# Patient Record
Sex: Female | Born: 1995 | Race: Asian | Hispanic: No | Marital: Single | Smoking: Never smoker
Health system: Southern US, Community
[De-identification: ages and names within clinical notes are randomized; demographics above are authoritative.]

## PROBLEM LIST (undated history)

## (undated) DIAGNOSIS — D649 Anemia, unspecified: Secondary | ICD-10-CM

## (undated) DIAGNOSIS — D509 Iron deficiency anemia, unspecified: Secondary | ICD-10-CM

## (undated) HISTORY — PX: APPENDECTOMY: SHX54

## (undated) HISTORY — DX: Iron deficiency anemia, unspecified: D50.9

---
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
## 2017-10-04 ENCOUNTER — Emergency Department (HOSPITAL_COMMUNITY)
Admission: EM | Admit: 2017-10-04 | Discharge: 2017-10-05 | Disposition: A | Discharge status: Home / Self Care | Payer: Self-pay | Attending: Emergency Medicine | Admitting: Emergency Medicine

## 2017-10-04 ENCOUNTER — Emergency Department (HOSPITAL_COMMUNITY): Payer: Self-pay

## 2017-10-04 ENCOUNTER — Encounter (HOSPITAL_COMMUNITY): Payer: Self-pay | Admitting: *Deleted

## 2017-10-04 DIAGNOSIS — B349 Viral infection, unspecified: Secondary | ICD-10-CM | POA: Insufficient documentation

## 2017-10-04 LAB — CBC WITH DIFFERENTIAL/PLATELET
Basophils Absolute: 0 10*3/uL (ref 0.0–0.1)
Basophils Relative: 0 %
EOS PCT: 0 %
Eosinophils Absolute: 0 10*3/uL (ref 0.0–0.7)
HEMATOCRIT: 40.3 % (ref 36.0–46.0)
HEMOGLOBIN: 12.7 g/dL (ref 12.0–15.0)
Lymphocytes Relative: 21 %
Lymphs Abs: 1.8 10*3/uL (ref 0.7–4.0)
MCH: 21.6 pg — ABNORMAL LOW (ref 26.0–34.0)
MCHC: 31.5 g/dL (ref 30.0–36.0)
MCV: 68.4 fL — AB (ref 78.0–100.0)
MONOS PCT: 3 %
Monocytes Absolute: 0.3 10*3/uL (ref 0.1–1.0)
NEUTROS ABS: 6.5 10*3/uL (ref 1.7–7.7)
NEUTROS PCT: 76 %
Platelets: 330 10*3/uL (ref 150–400)
RBC: 5.89 MIL/uL — ABNORMAL HIGH (ref 3.87–5.11)
RDW: 13.6 % (ref 11.5–15.5)
WBC: 8.6 10*3/uL (ref 4.0–10.5)

## 2017-10-04 LAB — LIPASE, BLOOD: LIPASE: 24 U/L (ref 11–51)

## 2017-10-04 LAB — COMPREHENSIVE METABOLIC PANEL
ALK PHOS: 61 U/L (ref 38–126)
ALT: 10 U/L — ABNORMAL LOW (ref 14–54)
ANION GAP: 10 (ref 5–15)
AST: 18 U/L (ref 15–41)
Albumin: 4.5 g/dL (ref 3.5–5.0)
BILIRUBIN TOTAL: 0.7 mg/dL (ref 0.3–1.2)
BUN: 7 mg/dL (ref 6–20)
CALCIUM: 9.2 mg/dL (ref 8.9–10.3)
CO2: 23 mmol/L (ref 22–32)
Chloride: 103 mmol/L (ref 101–111)
Creatinine, Ser: 0.75 mg/dL (ref 0.44–1.00)
Glucose, Bld: 108 mg/dL — ABNORMAL HIGH (ref 65–99)
POTASSIUM: 3.8 mmol/L (ref 3.5–5.1)
Sodium: 136 mmol/L (ref 135–145)
TOTAL PROTEIN: 7.8 g/dL (ref 6.5–8.1)

## 2017-10-04 LAB — URINALYSIS, ROUTINE W REFLEX MICROSCOPIC
Bilirubin Urine: NEGATIVE
Glucose, UA: NEGATIVE mg/dL
Hgb urine dipstick: NEGATIVE
Ketones, ur: NEGATIVE mg/dL
Leukocytes, UA: NEGATIVE
NITRITE: NEGATIVE
PH: 8 (ref 5.0–8.0)
Protein, ur: NEGATIVE mg/dL
Specific Gravity, Urine: 1.015 (ref 1.005–1.030)

## 2017-10-04 LAB — I-STAT CG4 LACTIC ACID, ED
LACTIC ACID, VENOUS: 2.15 mmol/L — AB (ref 0.5–1.9)
LACTIC ACID, VENOUS: 1.51 mmol/L (ref 0.5–1.9)

## 2017-10-04 LAB — POC URINE PREG, ED: Preg Test, Ur: NEGATIVE

## 2017-10-04 LAB — SALICYLATE LEVEL

## 2017-10-04 MED ORDER — SODIUM CHLORIDE 0.9 % IV BOLUS (SEPSIS)
1000.0000 mL | Freq: Once | INTRAVENOUS | Status: AC
  Administered 2017-10-04: 1000 mL via INTRAVENOUS

## 2017-10-04 MED ORDER — KETOROLAC TROMETHAMINE 15 MG/ML IJ SOLN
15.0000 mg | Freq: Once | INTRAMUSCULAR | Status: AC
  Administered 2017-10-04: 15 mg via INTRAVENOUS
  Filled 2017-10-04: qty 1

## 2017-10-04 MED ORDER — ONDANSETRON 4 MG PO TBDP: 4.0000 mg | ORAL_TABLET | Freq: Three times a day (TID) | ORAL | 0 refills | Status: DC | PRN

## 2017-10-04 MED ORDER — ACETAMINOPHEN 325 MG PO TABS
ORAL_TABLET | ORAL | Status: AC
  Filled 2017-10-04: qty 2

## 2017-10-04 MED ORDER — ACETAMINOPHEN 325 MG PO TABS
650.0000 mg | ORAL_TABLET | Freq: Once | ORAL | Status: AC | PRN
  Administered 2017-10-04: 650 mg via ORAL

## 2017-10-04 NOTE — ED Triage Notes (Signed)
Pt fell last Thursday after tripping on a bookbag and hit her head on a wall. Pt has been having dizziness, NV, and abdominal cramping since Friday. Pt denies urinary problems or cough; has been having chills with fever and was taking aspirin and ibuprofen for pain

## 2017-10-04 NOTE — ED Notes (Signed)
Dr Adela LankFloyd given a copy of lactic acid results 2.15

## 2017-10-04 NOTE — Discharge Instructions (Signed)
Take tylenol 2 pills 4 times a day and motrin 4 pills 3 times a day.  Drink plenty of fluids.  Return for worsening shortness of breath, headache, confusion. Follow up with your family doctor.   

## 2017-10-04 NOTE — ED Provider Notes (Signed)
[facility] EMERGENCY [facility]PARTMENT Provider Note   CSN: [id number] Arrival date & time: 10/04/17  [id number]     History   Chief Complaint Chief Complaint  Patient presents with  . Fever    HPI Lauren Bonilla is a 21 y.o. female.  21 yo F with a chief complaint of fevers chills myalgias and nausea.  Going on for about 4 days.  No known sick contacts.  Denies cough congestion vomiting or diarrhea.  Has had some very mild abdominal discomfort.  Denies vaginal bleeding or discharge.  Has tried Tylenol and aspirin with transient relief.  Had decreased oral intake.   The history is provided by the patient.  Fever   This is a new problem. The current episode started more than 2 days ago. The problem occurs constantly. The problem has not changed since onset.The maximum temperature noted was 102 to 102.9 F. The temperature was taken using an oral thermometer. Associated symptoms include muscle aches. Pertinent negatives include no chest pain, no vomiting, no congestion and no headaches. She has tried acetaminophen (aspirin) for the symptoms. The treatment provided no relief.    History reviewed. No pertinent past medical history.  Patient Active Problem List   Diagnosis Date Noted  . Acute appendicitis 06/18/2015  . Appendicitis with perforation 06/18/2015    Past Surgical History:  Procedure Laterality Date  . LAPAROSCOPIC APPENDECTOMY N/A 06/18/2015   Procedure: APPENDECTOMY LAPAROSCOPIC;  Surgeon: Leonia CoronaShuaib Farooqui, MD;  Location: [facility] OR;  Service: Pediatrics;  Laterality: N/A;    OB History    Gravida Para Term Preterm AB Living   0 0 0 0 0 0   SAB TAB Ectopic Multiple Live Births   0 0 0 0         Home Medications    Prior to Admission medications   Medication Sig Start Date End Date Taking? Authorizing Provider  bismuth subsalicylate (PEPTO BISMOL) 262 MG/15ML suspension Take 30 mLs by mouth every 6 (six) hours as needed for indigestion or diarrhea or loose  stools.    [provider]  cephALEXin (KEFLEX) 500 MG capsule Take 1 capsule (500 mg total) by mouth 4 (four) times daily. Take all of medicine and drink lots of fluids 06/21/16   Linna HoffKindl, James D, MD  HYDROcodone-acetaminophen (HYCET) 7.5-325 mg/15 ml solution Take 10 mLs by mouth every 6 (six) hours as needed for moderate pain. 06/19/15   Leonia CoronaFarooqui, Shuaib, MD  ondansetron (ZOFRAN ODT) 4 MG disintegrating tablet Take 1 tablet (4 mg total) by mouth every 8 (eight) hours as needed for nausea or vomiting. 10/04/17   Melene PlanFloyd, Chamari Cutbirth, DO    Family History No family history on file.  Social History Social History  Substance Use Topics  . Smoking status: Never Smoker  . Smokeless tobacco: Never Used  . Alcohol use No     Allergies   Patient has no known allergies.   Review of Systems Review of Systems  Constitutional: Positive for chills and fever.  HENT: Negative for congestion and rhinorrhea.   Eyes: Negative for redness and visual disturbance.  Respiratory: Negative for shortness of breath and wheezing.   Cardiovascular: Negative for chest pain and palpitations.  Gastrointestinal: Positive for nausea. Negative for vomiting.  Genitourinary: Negative for dysuria and urgency.  Musculoskeletal: Positive for arthralgias and myalgias.  Skin: Negative for pallor and wound.  Neurological: Negative for dizziness and headaches.     Physical Exam Updated Vital Signs BP 111/79   Pulse (!) 106  Temp (!) 101 F (38.3 C) (Oral)   Resp 12   LMP 09/10/2017   SpO2 98%   Physical Exam  Constitutional: She is oriented to person, place, and time. She appears well-developed and well-nourished. No distress.  HENT:  Head: Normocephalic and atraumatic.  Eyes: Pupils are equal, round, and reactive to light. EOM are normal.  Neck: Normal range of motion. Neck supple.  Cardiovascular: Regular rhythm.  Tachycardia present.  Exam reveals no gallop and no friction rub.   No murmur  heard. Pulmonary/Chest: Effort normal. She has no wheezes. She has no rales.  Abdominal: Soft. She exhibits no distension and no mass. There is no tenderness. There is no guarding.  Musculoskeletal: She exhibits no edema or tenderness.  Neurological: She is alert and oriented to person, place, and time.  Skin: Skin is warm and dry. She is not diaphoretic.  Psychiatric: She has a normal mood and affect. Her behavior is normal.  Nursing note and vitals reviewed.    ED Treatments / Results  Labs (all labs ordered are listed, but only abnormal results are displayed) Labs Reviewed  COMPREHENSIVE METABOLIC PANEL - Abnormal; Notable for the following:       Result Value   Glucose, Bld 108 (*)    ALT 10 (*)    All other components within normal limits  CBC WITH DIFFERENTIAL/PLATELET - Abnormal; Notable for the following:    RBC 5.89 (*)    MCV 68.4 (*)    MCH 21.6 (*)    All other components within normal limits  I-STAT CG4 LACTIC ACID, ED - Abnormal; Notable for the following:    Lactic Acid, Venous 2.15 (*)    All other components within normal limits  URINALYSIS, ROUTINE W REFLEX MICROSCOPIC  LIPASE, BLOOD  SALICYLATE LEVEL  INFLUENZA PANEL BY PCR (TYPE A & B)  POC URINE PREG, ED  I-STAT CG4 LACTIC ACID, ED    EKG  EKG Interpretation  Date/Time:  Wednesday October 04 2017 19:38:26 EDT Ventricular Rate:  162 PR Interval:    QRS Duration: 66 QT Interval:  294 QTC Calculation: 482 R Axis:   95 Text Interpretation:  Supraventricular tachycardia Rightward axis Nonspecific ST and T wave abnormality Abnormal ECG No old tracing to compare Confirmed by Melene Plan [phone]) on 10/04/2017 9:58:45 PM       Radiology Dg Chest 2 View  Result Date: 10/04/2017 CLINICAL DATA:  Fevers EXAM: CHEST  2 VIEW COMPARISON:  05/10/2013 FINDINGS: The heart size and mediastinal contours are within normal limits. Both lungs are clear. The visualized skeletal structures are unremarkable.  IMPRESSION: No active cardiopulmonary disease. Electronically Signed   By: Alcide Clever M.D.   On: 10/04/2017 21:21    Procedures Procedures (including critical care time)  Medications Ordered in ED Medications  acetaminophen (TYLENOL) tablet 650 mg (650 mg Oral Given 10/04/17 1934)  sodium chloride 0.9 % bolus 1,000 mL (1,000 mLs Intravenous New Bag/Given 10/04/17 2236)  ketorolac (TORADOL) 15 MG/ML injection 15 mg (15 mg Intravenous Given 10/04/17 2235)     Initial Impression / Assessment and Plan / ED Course  I have reviewed the triage vital signs and the nursing notes.  Pertinent labs & imaging results that were available during my care of the patient were reviewed by me and considered in my medical decision making (see chart for details).     21 yo F with a chief complaint of fevers chills myalgias and nausea.  Somewhat flulike and its acute onset.  Patient had significant tachycardia on arrival into the 160s.  Improved with IV fluids Tylenol and Toradol.  Patient denies chest pain or shortness of breath.  Feeling much better on reassessment.  Salicylate level negative.  Discharge home.  CRITICAL CARE Performed by: Rae Roam   Total critical care time: 35 minutes  Critical care time was exclusive of separately billable procedures and treating other patients.  Critical care was necessary to treat or prevent imminent or life-threatening deterioration.  Critical care was time spent personally by me on the following activities: development of treatment plan with patient and/or surrogate as well as nursing, discussions with consultants, evaluation of patient's response to treatment, examination of patient, obtaining history from patient or surrogate, ordering and performing treatments and interventions, ordering and review of laboratory studies, ordering and review of radiographic studies, pulse oximetry and re-evaluation of patient's condition.   11:19 PM:  I have  discussed the diagnosis/risks/treatment options with the patient and family and believe the pt to be eligible for discharge home to follow-up with PCP. We also discussed returning to the ED immediately if new or worsening sx occur. We discussed the sx which are most concerning (e.g., sudden worsening pain, fever, inability to tolerate by mouth) that necessitate immediate return. Medications administered to the patient during their visit and any new prescriptions provided to the patient are listed below.  Medications given during this visit Medications  acetaminophen (TYLENOL) tablet 650 mg (650 mg Oral Given 10/04/17 1934)  sodium chloride 0.9 % bolus 1,000 mL (1,000 mLs Intravenous New Bag/Given 10/04/17 2236)  ketorolac (TORADOL) 15 MG/ML injection 15 mg (15 mg Intravenous Given 10/04/17 2235)     The patient appears reasonably screen and/or stabilized for discharge and I doubt any other medical condition or other [facility] requiring further screening, evaluation, or treatment in the ED at this time prior to discharge.    Final Clinical Impressions(s) / ED Diagnoses   Final diagnoses:  Viral illness    New Prescriptions New Prescriptions   ONDANSETRON (ZOFRAN ODT) 4 MG DISINTEGRATING TABLET    Take 1 tablet (4 mg total) by mouth every 8 (eight) hours as needed for nausea or vomiting.     Melene Plan, DO 10/04/17 2319

## 2017-10-05 LAB — INFLUENZA PANEL BY PCR (TYPE A & B)
INFLAPCR: NEGATIVE
INFLBPCR: NEGATIVE

## 2018-12-12 NOTE — L&D Delivery Note (Signed)
Delivery Note Patient pushed for approximately 2 hours after she was noted to be C/C/+2.  At 8:33 PM a viable and healthy female was delivered via Vaginal, Spontaneous (Presentation:ROA).  APGAR: 8/9 weight pending. Shoulders and body easily delivered.  Baby laid on maternal abdomen. Delayed cord clamping done. Cord then double clamped and cut. Cord blood obtained. Placenta spontaneously delivered intact, 3 vessels noted.  Uterine atony alleviated by massage and IV pitocin.  Second degree vaginal and perineal laceration noted with bilateral labial lacerations.  The sphincter was intact but stretched.  The second degree was repaired with 2-0 vicryl in routine fashion. The labial lacerations were repaired with interrupted suture of 3-0 vicryl on SH.  Patient tolerated delivery well. There were no complications.   Anesthesia:  Epidural Episiotomy: None Lacerations: 2nd degree;Perineal bilateral labial Suture Repair: 2.0 vicryl Est. Blood Loss (mL):  336 Mom to postpartum.  Baby to Couplet care / Skin to Skin.  [place], [place] 06/25/2019, 9:07 PM

## 2019-06-25 ENCOUNTER — Encounter (HOSPITAL_COMMUNITY): Payer: Self-pay | Admitting: *Deleted

## 2019-06-25 ENCOUNTER — Inpatient Hospital Stay (HOSPITAL_COMMUNITY): Payer: Medicaid Other | Admitting: Anesthesiology

## 2019-06-25 ENCOUNTER — Other Ambulatory Visit: Payer: Self-pay

## 2019-06-25 ENCOUNTER — Inpatient Hospital Stay (HOSPITAL_COMMUNITY)
Admission: EM | Admit: 2019-06-25 | Discharge: 2019-06-27 | DRG: 807 | Disposition: A | Discharge status: Home / Self Care | Payer: Medicaid Other | Attending: Obstetrics & Gynecology | Admitting: Obstetrics & Gynecology

## 2019-06-25 DIAGNOSIS — Z3A39 39 weeks gestation of pregnancy: Secondary | ICD-10-CM | POA: Diagnosis not present

## 2019-06-25 DIAGNOSIS — O26893 Other specified pregnancy related conditions, third trimester: Secondary | ICD-10-CM | POA: Diagnosis present

## 2019-06-25 DIAGNOSIS — Z1159 Encounter for screening for other viral diseases: Secondary | ICD-10-CM

## 2019-06-25 HISTORY — DX: Anemia, unspecified: D64.9

## 2019-06-25 LAB — OB RESULTS CONSOLE GC/CHLAMYDIA
Chlamydia: NEGATIVE
Gonorrhea: NEGATIVE

## 2019-06-25 LAB — TYPE AND SCREEN
ABO/RH(D): AB POS
Antibody Screen: NEGATIVE

## 2019-06-25 LAB — COMPREHENSIVE METABOLIC PANEL
ALT: 9 U/L (ref 0–44)
AST: 20 U/L (ref 15–41)
Albumin: 3.1 g/dL — ABNORMAL LOW (ref 3.5–5.0)
Alkaline Phosphatase: 187 U/L — ABNORMAL HIGH (ref 38–126)
Anion gap: 12 (ref 5–15)
BUN: 8 mg/dL (ref 6–20)
CO2: 23 mmol/L (ref 22–32)
Calcium: 9.8 mg/dL (ref 8.9–10.3)
Chloride: 105 mmol/L (ref 98–111)
Creatinine, Ser: 0.74 mg/dL (ref 0.44–1.00)
GFR calc Af Amer: >60 mL/min (ref >60)
GFR calc non Af Amer: >60 mL/min (ref >60)
Glucose, Bld: 74 mg/dL (ref 70–99)
Potassium: 4.1 mmol/L (ref 3.5–5.1)
Sodium: 140 mmol/L (ref 135–145)
Total Bilirubin: 0.2 mg/dL — ABNORMAL LOW (ref 0.3–1.2)
Total Protein: 6.7 g/dL (ref 6.5–8.1)

## 2019-06-25 LAB — SARS CORONAVIRUS 2 BY RT PCR (HOSPITAL ORDER, PERFORMED IN ~~LOC~~ HOSPITAL LAB): SARS Coronavirus 2: NEGATIVE

## 2019-06-25 LAB — CBC
HCT: 41.6 % (ref 36.0–46.0)
Hemoglobin: 12.7 g/dL (ref 12.0–15.0)
MCH: 22.6 pg — ABNORMAL LOW (ref 26.0–34.0)
MCHC: 30.5 g/dL (ref 30.0–36.0)
MCV: 74.2 fL — ABNORMAL LOW (ref 80.0–100.0)
Platelets: 178 10*3/uL (ref 150–400)
RBC: 5.61 MIL/uL — ABNORMAL HIGH (ref 3.87–5.11)
RDW: 14.6 % (ref 11.5–15.5)
WBC: 10.4 10*3/uL (ref 4.0–10.5)
nRBC: 0 % (ref 0.0–0.2)

## 2019-06-25 LAB — OB RESULTS CONSOLE ABO/RH: RH Type: POSITIVE

## 2019-06-25 LAB — OB RESULTS CONSOLE GBS: GBS: NEGATIVE

## 2019-06-25 LAB — OB RESULTS CONSOLE HIV ANTIBODY (ROUTINE TESTING): HIV: NONREACTIVE

## 2019-06-25 LAB — OB RESULTS CONSOLE ANTIBODY SCREEN: Antibody Screen: NEGATIVE

## 2019-06-25 LAB — OB RESULTS CONSOLE RUBELLA ANTIBODY, IGM: Rubella: NON-IMMUNE/NOT IMMUNE

## 2019-06-25 LAB — OB RESULTS CONSOLE HEPATITIS B SURFACE ANTIGEN: Hepatitis B Surface Ag: NEGATIVE

## 2019-06-25 LAB — OB RESULTS CONSOLE RPR: RPR: NONREACTIVE

## 2019-06-25 LAB — PROTEIN / CREATININE RATIO, URINE
Creatinine, Urine: 149.5 mg/dL
Protein Creatinine Ratio: 0.1 mg/mg{Cre} (ref 0.00–0.15)
Total Protein, Urine: 15 mg/dL

## 2019-06-25 MED ORDER — PRENATAL MULTIVITAMIN CH
1.0000 | ORAL_TABLET | Freq: Every day | ORAL | Status: DC
  Administered 2019-06-26: 1 via ORAL
  Filled 2019-06-25: qty 1

## 2019-06-25 MED ORDER — EPHEDRINE 5 MG/ML INJ: 10.0000 mg | INTRAVENOUS | Status: DC | PRN

## 2019-06-25 MED ORDER — ONDANSETRON HCL 4 MG/2ML IJ SOLN: 4.0000 mg | Freq: Four times a day (QID) | INTRAMUSCULAR | Status: DC | PRN

## 2019-06-25 MED ORDER — LACTATED RINGERS IV SOLN: 500.0000 mL | Freq: Once | INTRAVENOUS | Status: DC

## 2019-06-25 MED ORDER — LACTATED RINGERS IV SOLN: 500.0000 mL | INTRAVENOUS | Status: DC | PRN

## 2019-06-25 MED ORDER — DIBUCAINE (PERIANAL) 1 % EX OINT: 1.0000 "application " | TOPICAL_OINTMENT | CUTANEOUS | Status: DC | PRN

## 2019-06-25 MED ORDER — ONDANSETRON HCL 4 MG PO TABS: 4.0000 mg | ORAL_TABLET | ORAL | Status: DC | PRN

## 2019-06-25 MED ORDER — DIPHENHYDRAMINE HCL 25 MG PO CAPS: 25.0000 mg | ORAL_CAPSULE | Freq: Four times a day (QID) | ORAL | Status: DC | PRN

## 2019-06-25 MED ORDER — SIMETHICONE 80 MG PO CHEW: 80.0000 mg | CHEWABLE_TABLET | ORAL | Status: DC | PRN

## 2019-06-25 MED ORDER — TETANUS-DIPHTH-ACELL PERTUSSIS 5-2.5-18.5 LF-MCG/0.5 IM SUSP: 0.5000 mL | Freq: Once | INTRAMUSCULAR | Status: DC

## 2019-06-25 MED ORDER — LIDOCAINE HCL (PF) 1 % IJ SOLN
INTRAMUSCULAR | Status: DC | PRN
  Administered 2019-06-25 (×2): 5 mL via EPIDURAL

## 2019-06-25 MED ORDER — SODIUM CHLORIDE (PF) 0.9 % IJ SOLN
INTRAMUSCULAR | Status: DC | PRN
  Administered 2019-06-25: 14 mL/h via EPIDURAL

## 2019-06-25 MED ORDER — OXYCODONE HCL 5 MG PO TABS: 5.0000 mg | ORAL_TABLET | ORAL | Status: DC | PRN

## 2019-06-25 MED ORDER — FENTANYL-BUPIVACAINE-NACL 0.5-0.125-0.9 MG/250ML-% EP SOLN
12.0000 mL/h | EPIDURAL | Status: DC | PRN
  Filled 2019-06-25 (×2): qty 250

## 2019-06-25 MED ORDER — COCONUT OIL OIL: 1.0000 "application " | TOPICAL_OIL | Status: DC | PRN

## 2019-06-25 MED ORDER — OXYCODONE-ACETAMINOPHEN 5-325 MG PO TABS: 1.0000 | ORAL_TABLET | ORAL | Status: DC | PRN

## 2019-06-25 MED ORDER — OXYCODONE-ACETAMINOPHEN 5-325 MG PO TABS: 2.0000 | ORAL_TABLET | ORAL | Status: DC | PRN

## 2019-06-25 MED ORDER — SOD CITRATE-CITRIC ACID 500-334 MG/5ML PO SOLN: 30.0000 mL | ORAL | Status: DC | PRN

## 2019-06-25 MED ORDER — ACETAMINOPHEN 325 MG PO TABS: 650.0000 mg | ORAL_TABLET | ORAL | Status: DC | PRN

## 2019-06-25 MED ORDER — OXYCODONE HCL 5 MG PO TABS: 10.0000 mg | ORAL_TABLET | ORAL | Status: DC | PRN

## 2019-06-25 MED ORDER — OXYTOCIN BOLUS FROM INFUSION
500.0000 mL | Freq: Once | INTRAVENOUS | Status: AC
  Administered 2019-06-25: 500 mL via INTRAVENOUS

## 2019-06-25 MED ORDER — PHENYLEPHRINE 40 MCG/ML (10ML) SYRINGE FOR IV PUSH (FOR BLOOD PRESSURE SUPPORT): 80.0000 ug | PREFILLED_SYRINGE | INTRAVENOUS | Status: DC | PRN

## 2019-06-25 MED ORDER — OXYTOCIN 40 UNITS IN NORMAL SALINE INFUSION - SIMPLE MED
2.5000 [IU]/h | INTRAVENOUS | Status: DC
  Filled 2019-06-25: qty 1000

## 2019-06-25 MED ORDER — LACTATED RINGERS IV SOLN
INTRAVENOUS | Status: DC
  Administered 2019-06-25 (×3): via INTRAVENOUS

## 2019-06-25 MED ORDER — OXYTOCIN 40 UNITS IN NORMAL SALINE INFUSION - SIMPLE MED: 2.5000 [IU]/h | INTRAVENOUS | Status: DC | PRN

## 2019-06-25 MED ORDER — FENTANYL-BUPIVACAINE-NACL 0.5-0.125-0.9 MG/250ML-% EP SOLN: 12.0000 mL/h | EPIDURAL | Status: DC | PRN

## 2019-06-25 MED ORDER — IBUPROFEN 600 MG PO TABS
600.0000 mg | ORAL_TABLET | Freq: Four times a day (QID) | ORAL | Status: DC
  Administered 2019-06-26 – 2019-06-27 (×6): 600 mg via ORAL
  Filled 2019-06-25 (×6): qty 1

## 2019-06-25 MED ORDER — BENZOCAINE-MENTHOL 20-0.5 % EX AERO
1.0000 "application " | INHALATION_SPRAY | CUTANEOUS | Status: DC | PRN
  Administered 2019-06-26: 1 via TOPICAL
  Filled 2019-06-25: qty 56

## 2019-06-25 MED ORDER — DIPHENHYDRAMINE HCL 50 MG/ML IJ SOLN: 12.5000 mg | INTRAMUSCULAR | Status: DC | PRN

## 2019-06-25 MED ORDER — LIDOCAINE HCL (PF) 1 % IJ SOLN: 30.0000 mL | INTRAMUSCULAR | Status: DC | PRN

## 2019-06-25 MED ORDER — SENNOSIDES-DOCUSATE SODIUM 8.6-50 MG PO TABS
2.0000 | ORAL_TABLET | ORAL | Status: DC
  Administered 2019-06-26 (×2): 2 via ORAL
  Filled 2019-06-25 (×2): qty 2

## 2019-06-25 MED ORDER — ZOLPIDEM TARTRATE 5 MG PO TABS: 5.0000 mg | ORAL_TABLET | Freq: Every evening | ORAL | Status: DC | PRN

## 2019-06-25 MED ORDER — WITCH HAZEL-GLYCERIN EX PADS: 1.0000 "application " | MEDICATED_PAD | CUTANEOUS | Status: DC | PRN

## 2019-06-25 MED ORDER — ONDANSETRON HCL 4 MG/2ML IJ SOLN: 4.0000 mg | INTRAMUSCULAR | Status: DC | PRN

## 2019-06-25 MED ORDER — LACTATED RINGERS IV SOLN: INTRAVENOUS | Status: DC

## 2019-06-25 NOTE — ED Triage Notes (Signed)
Pt. In labor, having contraction, Called MAU to [place], RN Clifton James, EMT transferred to MAU

## 2019-06-25 NOTE — MAU Note (Signed)
Presents with c/o regular ctxs since 0745.  Denies VB or LOF.  Endorses +FM.

## 2019-06-25 NOTE — Anesthesia Procedure Notes (Signed)
Epidural Patient location during procedure: OB  Staffing Anesthesiologist: Brae Schaafsma, MD Performed: anesthesiologist   Preanesthetic Checklist Completed: patient identified, site marked, surgical consent, pre-op evaluation, timeout performed, IV checked, risks and benefits discussed and monitors and equipment checked  Epidural Patient position: sitting Prep: DuraPrep Patient monitoring: heart rate, continuous pulse ox and blood pressure Approach: right paramedian Location: L3-L4 Injection technique: LOR saline  Needle:  Needle type: Tuohy  Needle gauge: 17 G Needle length: 9 cm and 9 Needle insertion depth: 5 cm Catheter type: closed end flexible Catheter size: 20 Guage Catheter at skin depth: 10 cm Test dose: negative  Assessment Events: blood not aspirated, injection not painful, no injection resistance, negative IV test and no paresthesia  Additional Notes Patient identified. Risks/Benefits/Options discussed with patient including but not limited to bleeding, infection, nerve damage, paralysis, failed block, incomplete pain control, headache, blood pressure changes, nausea, vomiting, reactions to medication both or allergic, itching and postpartum back pain. Confirmed with bedside nurse the patient's most recent platelet count. Confirmed with patient that they are not currently taking any anticoagulation, have any bleeding history or any family history of bleeding disorders. Patient expressed understanding and wished to proceed. All questions were answered. Sterile technique was used throughout the entire procedure. Please see nursing notes for vital signs. Test dose was given through epidural needle and negative prior to continuing to dose epidural or start infusion. Warning signs of high block given to the patient including shortness of breath, tingling/numbness in hands, complete motor block, or any concerning symptoms with instructions to call for help. Patient was given  instructions on fall risk and not to get out of bed. All questions and concerns addressed with instructions to call with any issues.     

## 2019-06-25 NOTE — H&P (Signed)
Lauren Bonilla is a 23 y.o. female presenting for labor. m OB History    Gravida  1   Para  0   Term  0   Preterm  0   AB  0   Living  0     SAB  0   TAB  0   Ectopic  0   Multiple  0   Live Births             Past Medical History:  Diagnosis Date  . Anemia    Past Surgical History:  Procedure Laterality Date  . APPENDECTOMY    . LAPAROSCOPIC APPENDECTOMY N/A 06/18/2015   Procedure: APPENDECTOMY LAPAROSCOPIC;  Surgeon: Gerald Stabs, MD;  Location: [place];  Service: Pediatrics;  Laterality: N/A;   Family History: family history is not on file. Social History:  reports that she has never smoked. She has never used smokeless tobacco. She reports that she does not drink alcohol or use drugs.     Maternal Diabetes: No Genetic Screening: Declined Maternal Ultrasounds/Referrals: Normal Fetal Ultrasounds or other Referrals:  None Maternal Substance Abuse:  No Significant Maternal Medications:  None Significant Maternal Lab Results:  Group B Strep negative Other Comments:  None  ROS History Dilation: Lip/rim Effacement (%): 100 Station: 0 Exam by:: k fields, rn Blood pressure 119/75, pulse 69, temperature 98.3 F (36.8 C), temperature source Oral, resp. rate 16, height 5\' 1"  (1.549 m), weight 60.8 kg, SpO2 99 %. Exam Physical Exam  Physical Examination: General appearance - alert, well appearing, and in no distress Chest - clear to auscultation, no wheezes, rales or rhonchi, symmetric air entry Heart - normal rate and regular rhythm Abdomen - soft, nontender, nondistended, no masses or organomegaly gravid Pelvic - normal external genitalia, vulva, vagina, cervix, uterus and adnexa,  Bloody show 7/100/-1 Extremities - peripheral pulses normal, no pedal edema, no clubbing or cyanosis, Homan's sign negative bilaterally Prenatal labs: ABO, Rh: AB/Positive/-- (07/14 1153) Antibody: Negative (07/14 1152) Rubella: Nonimmune (07/14 1152) RPR: Nonreactive (07/14  1152)  HBsAg: Negative (07/14 1152)  HIV: Non-reactive (07/14 1152)  GBS: Negative (07/14 1152)   Assessment/Plan: Term in labor Desires Epidural for pain AROM clear Anticipate SVD   Lauren Bonilla A Lauren Bonilla 06/25/2019, 4:21 PM

## 2019-06-25 NOTE — Anesthesia Preprocedure Evaluation (Signed)
Anesthesia Evaluation  Patient identified by MRN, date of birth, ID band Patient awake    Reviewed: Allergy & Precautions, H&P , NPO status , Patient's Chart, lab work & pertinent test results  History of Anesthesia Complications Negative for: history of anesthetic complications  Airway Mallampati: II  TM Distance: >3 FB Neck ROM: full    Dental no notable dental hx. (+) Teeth Intact   Pulmonary neg pulmonary ROS,    Pulmonary exam normal breath sounds clear to auscultation       Cardiovascular negative cardio ROS Normal cardiovascular exam Rhythm:regular Rate:Normal     Neuro/Psych negative neurological ROS  negative psych ROS   GI/Hepatic negative GI ROS, Neg liver ROS,   Endo/Other  negative endocrine ROS  Renal/GU negative Renal ROS  negative genitourinary   Musculoskeletal   Abdominal   Peds  Hematology negative hematology ROS (+)   Anesthesia Other Findings Covid test pending. Full PPE necessary  Reproductive/Obstetrics (+) Pregnancy                             Anesthesia Physical Anesthesia Plan  ASA: II  Anesthesia Plan: Epidural   Post-op Pain Management:    Induction:   PONV Risk Score and Plan:   Airway Management Planned:   Additional Equipment:   Intra-op Plan:   Post-operative Plan:   Informed Consent: I have reviewed the patients History and Physical, chart, labs and discussed the procedure including the risks, benefits and alternatives for the proposed anesthesia with the patient or authorized representative who has indicated his/her understanding and acceptance.       Plan Discussed with:   Anesthesia Plan Comments:         Anesthesia Quick Evaluation

## 2019-06-26 LAB — CBC
HCT: 32.7 % — ABNORMAL LOW (ref 36.0–46.0)
Hemoglobin: 10 g/dL — ABNORMAL LOW (ref 12.0–15.0)
MCH: 22.8 pg — ABNORMAL LOW (ref 26.0–34.0)
MCHC: 30.6 g/dL (ref 30.0–36.0)
MCV: 74.7 fL — ABNORMAL LOW (ref 80.0–100.0)
Platelets: 139 10*3/uL — ABNORMAL LOW (ref 150–400)
RBC: 4.38 MIL/uL (ref 3.87–5.11)
RDW: 14.2 % (ref 11.5–15.5)
WBC: 14.1 10*3/uL — ABNORMAL HIGH (ref 4.0–10.5)
nRBC: 0 % (ref 0.0–0.2)

## 2019-06-26 LAB — ABO/RH: ABO/RH(D): AB POS

## 2019-06-26 LAB — RPR: RPR Ser Ql: NONREACTIVE

## 2019-06-26 NOTE — Progress Notes (Signed)
Post Partum Day 1 Subjective:  Well. Lochia are normal. Voiding, ambulating, tolerating normal diet. nursing going well. Reports perineal pain  Objective: Blood pressure 112/78, pulse 65, temperature 98.6 F (37 C), temperature source Oral, resp. rate 18, height 5\' 1"  (1.549 m), weight 60.8 kg, SpO2 99 %, unknown if currently breastfeeding.  Physical Exam:  General: normal Lochia: appropriate Uterine Fundus: 0/2 firm non-tender  Extremities: No evidence of DVT seen on physical exam. Edema minimal    Recent Labs    06/25/19 1107 06/26/19 0637  HGB 12.7 10.0*  HCT 41.6 32.7*    Assessment/Plan: Normal Post-partum. Continue routine post-partum care. Anticipate discharge tomorrow Will add Sitz bath TID    LOS: 1 day   Dede Query Lauren Paganelli MD 06/26/2019, 10:20 AM

## 2019-06-26 NOTE — Lactation Note (Signed)
This note was copied from a baby's chart. Lactation Consultation Note  Patient Name: Lauren Bonilla Today's Date: 06/26/2019 Reason for consult: Initial assessment;Term;Primapara;1st time breastfeeding  P1 mother whose infant is now 14 hours old.  Mother's feeding preference is breast/bottle.  Baby was asleep in father's arms when I arrived.  Mother had no immediate questions/concerns related to breast feeding.  She is familiar with feeding cues and hand expression and has been able to express drops of colostrum.  Container provided and milk storage times reviewed.  Finger feeding demonstrated.  Encouraged to feed 8-12 times/24 hours or sooner if baby shows feeding cues.  Suggested mother call her RN/Lc for latch assistance as needed.  Mother will be returning to work after leave and has a DEBP for home use.     Maternal Data Formula Feeding for Exclusion: Yes Reason for exclusion: Mother's choice to formula and breast feed on admission Has patient been taught Hand Expression?: Yes Does the patient have breastfeeding experience prior to this delivery?: No  Feeding    LATCH Score                   Interventions    Lactation Tools Discussed/Used     Consult Status Consult Status: Follow-up Date: 06/27/19 Follow-up type: In-patient    Little Ishikawa 06/26/2019, 12:57 PM

## 2019-06-26 NOTE — Discharge Summary (Signed)
SVD OB Discharge Summary     Patient Name: Lauren Bonilla DOB: 07/12/1996 MRN: [id number]  Date of admission: 06/25/2019 Delivering MD: Sanjuana Kava  Date of delivery: 06/25/2019 Type of delivery: SVD  Newborn Data: Sex: Baby Female  Live born female  Birth Weight: 7 lb 0.2 oz (3181 g) APGAR: 67, 9  Newborn Delivery   Birth date/time: 06/25/2019 20:33:00 Delivery type: Vaginal, Spontaneous      Feeding: breast and bottle Infant being discharge to home with mother in stable condition.   Admitting diagnosis: Active Labor Intrauterine pregnancy: [redacted]w[redacted]d     Secondary diagnosis:  Active Problems:   Normal labor   Normal postpartum course                                Complications: None                                                              Intrapartum Procedures: spontaneous vaginal delivery Postpartum Procedures: none Complications-Operative and Postpartum: 2nd degree degree perineal laceration Augmentation: AROM   History of Present Illness: Lauren Bonilla is a 23 y.o. female, G1P1001, who presents at [redacted]w[redacted]d weeks gestation. The patient has been followed at  [facility]  Her pregnancy has been complicated by:  Patient Active Problem List   Diagnosis Date Noted  . Normal postpartum course 06/26/2019  . Normal labor 06/25/2019  . Acute appendicitis 06/18/2015  . Appendicitis with perforation 06/18/2015    Hospital course:  Onset of Labor With Vaginal Delivery     23 y.o. yo G1P1001 at [redacted]w[redacted]d was admitted in Active Labor on 06/25/2019. Patient had an uncomplicated labor course as follows:  Membrane Rupture Time/Date: 1:40 PM ,06/25/2019   Intrapartum Procedures: Episiotomy: None [1]                                         Lacerations:  2nd degree [3];Perineal [11]  Patient had a delivery of a Viable infant. 06/25/2019  Information for the patient's newborn:  Hulme, Girl Kamariyah [[id number]]  Delivery Method: Vaginal, Spontaneous(Filed from  Delivery Summary)     Pateint had an uncomplicated postpartum course.  She is ambulating, tolerating a regular diet, passing flatus, and urinating well. Patient is discharged home in stable condition on 06/27/19.  Postpartum Day # 2 : S/P NSVD due to spontaneous labor. Patient up ad lib, denies syncope or dizziness. Reports consuming regular diet without issues and denies N/V. Patient reports 0 bowel movement + passing flatus.  Denies issues with urination and reports bleeding is "lighter."  Patient is breast and bottle feeding and reports going well.  Desires undecided for postpartum contraception.  Pain is being appropriately managed with use of po meds.   Physical exam  Vitals:   06/26/19 0745 06/26/19 1502 06/26/19 2203 06/27/19 0513  BP: 112/78 112/79 (!) 90/54 109/80  Pulse: 65 (!) 59 70 62  Resp: 18 17 16    Temp: 98.6 F (37 C) 98 F (36.7 C) 98.1 F (36.7 C) 97.7 F (36.5 C)  TempSrc: Oral Oral Oral   SpO2: 99% 98% 99%  Weight:      Height:       General: alert, cooperative and no distress Lochia: appropriate Uterine Fundus: firm Perineum: approximate, no hematoma noted DVT Evaluation: No evidence of DVT seen on physical exam. Negative Homan's sign. No cords or calf tenderness. No significant calf/ankle edema.  Labs: Lab Results  Component Value Date   WBC 14.1 (H) 06/26/2019   HGB 10.0 (L) 06/26/2019   HCT 32.7 (L) 06/26/2019   MCV 74.7 (L) 06/26/2019   PLT 139 (L) 06/26/2019   CMP Latest Ref Rng & Units 06/25/2019  Glucose 70 - 99 mg/dL 74  BUN 6 - 20 mg/dL 8  Creatinine 1.610.44 - 0.961.00 mg/dL 0.450.74  Sodium 409135 - 811145 mmol/L 140  Potassium 3.5 - 5.1 mmol/L 4.1  Chloride 98 - 111 mmol/L 105  CO2 22 - 32 mmol/L 23  Calcium 8.9 - 10.3 mg/dL 9.8  Total Protein 6.5 - 8.1 g/dL 6.7  Total Bilirubin 0.3 - 1.2 mg/dL 9.1(Y0.2(L)  Alkaline Phos 38 - 126 U/L 187(H)  AST 15 - 41 U/L 20  ALT 0 - 44 U/L 9    Date of discharge: 06/27/2019 Discharge Diagnoses: Term  Pregnancy-delivered Discharge instruction: per After Visit Summary and "Baby and Me Booklet".  After visit meds:   Activity:           unrestricted and pelvic rest Advance as tolerated. Pelvic rest for 6 weeks.  Diet:                routine Medications: PNV and Ibuprofen Postpartum contraception: Undecided Condition:  Pt discharge to home with baby in stable  Meds: Allergies as of 06/27/2019   No Known Allergies     Medication List    TAKE these medications   ibuprofen 600 MG tablet Commonly known as: ADVIL Take 1 tablet (600 mg total) by mouth every 6 (six) hours.   prenatal vitamin w/FE, FA 27-1 MG Tabs tablet Take 1 tablet by mouth daily at 12 noon.      Discharge Follow Up:  Follow-up Information    [facility] in 6 week(s).   Specialty: Obstetrics and Gynecology Contact information: [address] [place] [phone]           CondonJade Darrian Goodwill, NP-C, CNM 06/27/2019, 6:59 AM  Dale DurhamJade Hiawatha Dressel, FNP

## 2019-06-26 NOTE — Anesthesia Postprocedure Evaluation (Signed)
Anesthesia Post Note  Patient: Lauren Bonilla  Procedure(s) Performed: AN AD HOC LABOR EPIDURAL     Patient location during evaluation: Mother Baby Anesthesia Type: Epidural Level of consciousness: awake and alert and oriented Pain management: satisfactory to patient Vital Signs Assessment: post-procedure vital signs reviewed and stable Respiratory status: spontaneous breathing and nonlabored ventilation Cardiovascular status: stable Postop Assessment: no headache, no backache, no signs of nausea or vomiting, adequate PO intake, patient able to bend at knees, no apparent nausea or vomiting and able to ambulate (patient up walking) Anesthetic complications: no    Last Vitals:  Vitals:   06/26/19 0358 06/26/19 0745  BP: 103/65 112/78  Pulse: 60 65  Resp: 18 18  Temp: 37 C 37 C  SpO2: 97% 99%    Last Pain:  Vitals:   06/26/19 0745  TempSrc: Oral  PainSc: 0-No pain   Pain Goal:                   Lauren Bonilla

## 2019-06-27 ENCOUNTER — Ambulatory Visit: Payer: Self-pay

## 2019-06-27 LAB — BIRTH TISSUE RECOVERY COLLECTION (PLACENTA DONATION)

## 2019-06-27 MED ORDER — IBUPROFEN 600 MG PO TABS: 600.0000 mg | ORAL_TABLET | Freq: Four times a day (QID) | ORAL | 0 refills | Status: DC

## 2019-06-27 NOTE — Progress Notes (Signed)
MMR VIS given to patient. She declines MMR at this time. Timoteo Ace, RN

## 2019-06-27 NOTE — Lactation Note (Signed)
This note was copied from a baby's chart. Lactation Consultation Note: Mother is a P1 and infant is at 8% weight loss.   [name] arrived in mothers room and she was breastfeeding infant in cradle hold. Infant tugging with strong tug. Mother denies having any nipple pain or tenderness. Observed sustained latch with suckles and swallows. Mother taught to do breast compression.   Advised mother to continue to cue base feed and to allow for cluster feeding . Mother to breastfeed infant 8-12 or more times in 24 hours. Reviewed treatment and prevention of engorgement. Mother was given a hand pump with instructions to use as needed. Mother has a electric pump at home. Mother is aware of available [place] services and community support.   Mother receptive to teaching.   Patient Name: Lauren Bonilla Today's Date: 06/27/2019 Reason for consult: Follow-up assessment   Maternal Data    Feeding Feeding Type: Breast Fed  LATCH Score Latch: Grasps breast easily, tongue down, lips flanged, rhythmical sucking.  Audible Swallowing: Spontaneous and intermittent  Type of Nipple: Everted at rest and after stimulation  Comfort (Breast/Nipple): Soft / non-tender  Hold (Positioning): Assistance needed to correctly position infant at breast and maintain latch.  LATCH Score: 9  Interventions Interventions: Assisted with latch;Skin to skin;Hand express;Breast compression;Adjust position;Support pillows;Position options;Expressed milk;Hand pump  Lactation Tools Discussed/Used     Consult Status Consult Status: Complete    Darla Lesches 06/27/2019, 11:03 AM

## 2022-12-12 NOTE — L&D Delivery Note (Signed)
Delivery Note Labor onset:   07/15/23 Labor Onset Time: 0700 Complete dilation at  0915 Onset of pushing at 0925 FHR second stage Cat 2 Analgesia/Anesthesia intrapartum: none  Guided pushing with maternal urge. Delivery of a viable female at [phone]. Fetal head delivered in LOA position.  Nuchal cord: body cord x1.  Infant placed on maternal abd, dried, and tactile stim.  Cord double clamped after 5 min and cut by Father.  RN x3 present for birth.  Cord blood sample collected: Yes Arterial cord blood sample collected: No  Placenta delivered Lauren Bonilla, intact, with 3 VC.  Placenta to L&D. Uterine tone firm, bleeding moderate w/ clots  1st degree laceration identified.  Hemostatic Anesthesia: none Repair: site hemostatic QBL/EBL (mL): 400 Complications: none APGAR: APGAR (1 MIN): 9  APGAR (5 MINS): 9  APGAR (10 MINS):   Mom to postpartum.  Baby to Couplet care / Skin to Skin. Baby boy "Lauren Bonilla" Circumcision yes  Roma Schanz DNP, CNM 07/15/2023, 10:06 AM

## 2023-01-16 LAB — OB RESULTS CONSOLE RUBELLA ANTIBODY, IGM: Rubella: NON-IMMUNE/NOT IMMUNE

## 2023-01-16 LAB — OB RESULTS CONSOLE HEPATITIS B SURFACE ANTIGEN: Hepatitis B Surface Ag: NEGATIVE

## 2023-01-19 LAB — OB RESULTS CONSOLE RPR: RPR: NONREACTIVE

## 2023-01-26 ENCOUNTER — Other Ambulatory Visit: Payer: Self-pay

## 2023-01-26 ENCOUNTER — Other Ambulatory Visit: Payer: Self-pay | Admitting: Obstetrics and Gynecology

## 2023-01-26 DIAGNOSIS — Z363 Encounter for antenatal screening for malformations: Secondary | ICD-10-CM

## 2023-02-15 ENCOUNTER — Encounter: Payer: Self-pay | Admitting: *Deleted

## 2023-02-23 ENCOUNTER — Ambulatory Visit: Payer: Medicaid Other | Attending: Obstetrics and Gynecology

## 2023-02-23 ENCOUNTER — Ambulatory Visit: Payer: Medicaid Other | Admitting: *Deleted

## 2023-02-23 ENCOUNTER — Ambulatory Visit (HOSPITAL_BASED_OUTPATIENT_CLINIC_OR_DEPARTMENT_OTHER): Payer: Medicaid Other

## 2023-02-23 ENCOUNTER — Other Ambulatory Visit: Payer: Self-pay | Admitting: *Deleted

## 2023-02-23 VITALS — BP 102/53 | HR 92

## 2023-02-23 DIAGNOSIS — O0932 Supervision of pregnancy with insufficient antenatal care, second trimester: Secondary | ICD-10-CM | POA: Diagnosis present

## 2023-02-23 DIAGNOSIS — Z363 Encounter for antenatal screening for malformations: Secondary | ICD-10-CM | POA: Diagnosis present

## 2023-02-23 DIAGNOSIS — D563 Thalassemia minor: Secondary | ICD-10-CM | POA: Diagnosis present

## 2023-02-23 DIAGNOSIS — Z362 Encounter for other antenatal screening follow-up: Secondary | ICD-10-CM

## 2023-02-23 NOTE — Progress Notes (Signed)
[facility] at [facility] [address] Phone:  [phone]   Fax:  [phone]    Name: Lauren Bonilla Indication: Maternal Constant Spring Variant  DOB: 18-Sep-1996 Age: 27 y.o.   EDD: 07/28/2023 LMP: 10/21/2022 Referring Provider:  Rex Kras Hands, NP  EGA: [redacted]w[redacted]d Genetic Counselor: Staci Righter, MS, CGC  OB Hx: G2P1001 Date of Appointment: 02/23/2023  Accompanied by: Kristine Linea Face to Face Time: 30 Minutes   Previous Testing Completed:  Hemoglobin fractionation cascade from 01/16/2023 reviewed. 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  Lauren Bonilla previously completed cell-free DNA screening (cfDNA) in this pregnancy. The result is low risk. This screening significantly reduces the risk that the current pregnancy has Down syndrome, Trisomy 28, Trisomy 56, and common sex chromosome conditions, however, the risk is not zero given the limitations of cfDNA. Additionally, there are many genetic conditions that cannot be detected by cfDNA. Lauren Bonilla previously completed carrier screening. She screened to not be a carrier for Cystic Fibrosis (CF), Spinal Muscular Atrophy (SMA), Duchenne/Becker Muscular Dystrophy and Fragile X syndrome. A negative result on carrier screening reduces the likelihood of being a carrier, however, does not entirely rule out the possibility.   Medical & Family History:  Reports she takes prenatal vitamins. Denies personal history of diabetes, high blood pressure, thyroid conditions, and seizures. Denies bleeding, infections, and fevers in this pregnancy. Denies using tobacco, alcohol, or street drugs in this pregnancy.  Maternal ethnicity reported as Asian and paternal ethnicity reported as Caucasian/Hispanic. Denies consanguinity. Lauren Bonilla denied a family history of chromosome conditions, intellectual disability, autism, birth defects, bone/skeletal disorders, blindness, deafness,  blood disorders, neuromuscular disorders, still births, early infant deaths, and 3 or more pregnancy losses for one person on her prenatal intake questionnaire.     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 Alpha Thalassemia refers to a group of autosomal recessive blood disorders that reduce the amount of hemoglobin, the protein in red blood cells that carries oxygen to tissues throughout the body. Hemoglobin is made up of both alpha globin and beta globin proteins. Alpha Thalassemia is different in its inheritance compared to other hemoglobinopathies as there are two alpha-globin genes on each chromosome 16 (??/??). Alpha Thalassemia occurs when three or more of the four alpha globin genes are deleted. Alpha Thalassemia can also occur when a person has specific changes, called 'point mutations', in two of the four genes. 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 The Constant Spring variant is the most common non-deletion variant in the alpha globin genes. It produces an elongated alpha globin chain protein, which has a thalassemia-like effect on red blood cells. The Constant Spring variant is especially common in [place], [place], and [place] populations. With this result, we know that Lauren Bonilla has three functional copies of the alpha-globin genes while her 4th alpha-globin gene produces this abnormal protein. Each of Lauren Bonilla's children will either inherit two functional genes (??) or one functional gene and one gene with the Constant Spring variation (??CS) from her.  Lauren Bonilla is not at an increased risk to have a baby with fetal hydrops due to Hemoglobin Barts disease (four deleted alpha-globin genes: --/--) regardless of her reproductive Bonilla's carrier status. Having four deleted alpha-globin genes results in severe anemia. Affected babies develop symptoms before birth and without treatment typically  do not survive the newborn period. If Lauren Bonilla reproductive Bonilla is  found to be an Alpha Thalassemia carrier in the cis configuration (two deleted alpha-globin genes on the same chromosome: ??/--) there would be a 25% risk for the current pregnancy to be affected with Hemoglobin H-Constant Spring Disease (two deleted alpha-globin genes and one non-functional alpha-globin gene with the point mutation: --/??CS). People with this condition typically have more severe anemia due to less efficient red blood cell production, moderately severe splenomegaly, leg ulcers, gallstones, jaundice, increased risk for infections, and more significant growth delays.  If Lauren Bonilla is found to have one point mutation or the Constant Spring mutation, there would be a 25% risk for the current pregnancy to be affected with Homozygous Point Mutation Disease or Homozygous Constant Spring disease (aacs/aacs). People with this condition have mild to severe anemia and symptoms similar to those seen in Hemoglobin H Disease.   Given Lauren Bonilla carrier screening result, carrier screening for her reproductive Bonilla was offered to determine risk for the current pregnancy. If both members of a couple are known to be carriers, diagnostic testing is available to determine if the pregnancy is affected. This can be performed via amniocentesis after [redacted] weeks gestation. This procedure involves the removal of a small amount of amniotic fluid from the sac surrounding the pregnancy with the use of a thin needle inserted through the maternal abdomen and uterus. Ultrasound guidance is used throughout the procedure. Possible procedural difficulties and complications that can arise include maternal infection, cramping, bleeding, fluid leakage, and/or pregnancy loss. The risk for pregnancy loss with an amniocentesis is 1/500. If diagnostic testing via amniocentesis is not desired, Alpha Thalassemia testing can be completed after birth.      Patient Plan:  Proceed with: We provided Tama a saliva kit to take home so that her reproductive Bonilla can complete carrier screening for Alpha Thalassemia. We discussed that results should be available approximately 3 weeks from when the laboratory receives his sample.  Informed consent was obtained. All questions were answered.    Thank you for sharing in the care of Lauren Bonilla with [place].  Please do not hesitate to contact us if you have any questions.  Staci Righter, MS, [facility]

## 2023-03-23 ENCOUNTER — Telehealth: Payer: Self-pay | Admitting: Genetics

## 2023-03-23 NOTE — Telephone Encounter (Signed)
Telephone call to Gwenna Esteban to discuss her partner's carrier screening saliva kit. Unfortunately, Kristal's partner Child psychotherapist) did not document the date of collection on his saliva sample before sending it to the laboratory. Therefore, his saliva sample cannot be used for testing. Avelina Laine is going to send Cristian another saliva kit so that he can repeat the sample collection process. Genetic counseling left Irianna a voicemail with this information and the [facility] telephone number if she has questions.

## 2023-03-27 ENCOUNTER — Ambulatory Visit: Payer: Medicaid Other | Admitting: *Deleted

## 2023-03-27 ENCOUNTER — Ambulatory Visit: Payer: Medicaid Other | Attending: Obstetrics

## 2023-03-27 ENCOUNTER — Encounter: Payer: Self-pay | Admitting: *Deleted

## 2023-03-27 ENCOUNTER — Other Ambulatory Visit: Payer: Self-pay | Admitting: *Deleted

## 2023-03-27 DIAGNOSIS — Z3A22 22 weeks gestation of pregnancy: Secondary | ICD-10-CM

## 2023-03-27 DIAGNOSIS — O3663X0 Maternal care for excessive fetal growth, third trimester, not applicable or unspecified: Secondary | ICD-10-CM

## 2023-03-27 DIAGNOSIS — O285 Abnormal chromosomal and genetic finding on antenatal screening of mother: Secondary | ICD-10-CM

## 2023-03-27 DIAGNOSIS — O3662X0 Maternal care for excessive fetal growth, second trimester, not applicable or unspecified: Secondary | ICD-10-CM | POA: Diagnosis not present

## 2023-03-27 DIAGNOSIS — D582 Other hemoglobinopathies: Secondary | ICD-10-CM

## 2023-03-27 DIAGNOSIS — Z3689 Encounter for other specified antenatal screening: Secondary | ICD-10-CM

## 2023-03-27 DIAGNOSIS — Z362 Encounter for other antenatal screening follow-up: Secondary | ICD-10-CM

## 2023-04-12 ENCOUNTER — Telehealth: Payer: Self-pay | Admitting: Obstetrics and Gynecology

## 2023-04-12 NOTE — Telephone Encounter (Signed)
Left voicemail for Ms. Dina to call back to review carrier testing results from her partner, Cari Caraway (dob 04/28/2000).  We can be reached at [phone].  Cherly Anderson, MS, CGC

## 2023-04-17 ENCOUNTER — Telehealth: Payer: Self-pay | Admitting: Obstetrics and Gynecology

## 2023-04-17 NOTE — Telephone Encounter (Signed)
PC to Lauren Bonilla with results of carrier screening for alpha thalassemia for her partner, Lauren Bonilla (dob 04/28/2000).  His results are negative, indicating that he is not a carrier for any changes in the gene for alpha hemoglobin.  This baby has a 50% chance to be a carrier like Lauren Bonilla, but is not at risk for health concerns due to alpha thalassemia.  We can be reached at [phone] with any questions.  Cherly Anderson, MS, CGC

## 2023-05-09 LAB — OB RESULTS CONSOLE HIV ANTIBODY (ROUTINE TESTING): HIV: NONREACTIVE

## 2023-05-09 LAB — HEPATITIS C ANTIBODY: HCV Ab: NEGATIVE

## 2023-05-17 ENCOUNTER — Encounter: Payer: Self-pay | Admitting: *Deleted

## 2023-05-22 ENCOUNTER — Ambulatory Visit: Payer: Medicaid Other | Attending: Obstetrics and Gynecology

## 2023-05-22 ENCOUNTER — Encounter: Payer: Self-pay | Admitting: *Deleted

## 2023-05-22 ENCOUNTER — Ambulatory Visit: Payer: Medicaid Other | Admitting: *Deleted

## 2023-05-22 DIAGNOSIS — O285 Abnormal chromosomal and genetic finding on antenatal screening of mother: Secondary | ICD-10-CM

## 2023-05-22 DIAGNOSIS — Z3A30 30 weeks gestation of pregnancy: Secondary | ICD-10-CM

## 2023-05-22 DIAGNOSIS — Z3689 Encounter for other specified antenatal screening: Secondary | ICD-10-CM | POA: Diagnosis present

## 2023-05-22 DIAGNOSIS — O3663X0 Maternal care for excessive fetal growth, third trimester, not applicable or unspecified: Secondary | ICD-10-CM | POA: Insufficient documentation

## 2023-05-22 DIAGNOSIS — Z148 Genetic carrier of other disease: Secondary | ICD-10-CM

## 2023-07-15 ENCOUNTER — Encounter (HOSPITAL_COMMUNITY): Payer: Self-pay | Admitting: Obstetrics and Gynecology

## 2023-07-15 ENCOUNTER — Inpatient Hospital Stay (HOSPITAL_COMMUNITY)
Admission: AD | Admit: 2023-07-15 | Discharge: 2023-07-16 | DRG: 807 | Disposition: A | Discharge status: Home / Self Care | Payer: Medicaid Other | Attending: Obstetrics and Gynecology | Admitting: Obstetrics and Gynecology

## 2023-07-15 DIAGNOSIS — Z2839 Other underimmunization status: Secondary | ICD-10-CM

## 2023-07-15 DIAGNOSIS — D509 Iron deficiency anemia, unspecified: Secondary | ICD-10-CM | POA: Diagnosis present

## 2023-07-15 DIAGNOSIS — Z3A39 39 weeks gestation of pregnancy: Secondary | ICD-10-CM

## 2023-07-15 DIAGNOSIS — O9902 Anemia complicating childbirth: Secondary | ICD-10-CM | POA: Diagnosis present

## 2023-07-15 DIAGNOSIS — O09899 Supervision of other high risk pregnancies, unspecified trimester: Secondary | ICD-10-CM

## 2023-07-15 DIAGNOSIS — O26893 Other specified pregnancy related conditions, third trimester: Secondary | ICD-10-CM | POA: Diagnosis present

## 2023-07-15 LAB — TYPE AND SCREEN
ABO/RH(D): AB POS
Antibody Screen: NEGATIVE

## 2023-07-15 LAB — CBC
HCT: 36.9 % (ref 36.0–46.0)
Hemoglobin: 11.5 g/dL — ABNORMAL LOW (ref 12.0–15.0)
MCH: 23.1 pg — ABNORMAL LOW (ref 26.0–34.0)
MCHC: 31.2 g/dL (ref 30.0–36.0)
MCV: 74.2 fL — ABNORMAL LOW (ref 80.0–100.0)
Platelets: 280 10*3/uL (ref 150–400)
RBC: 4.97 MIL/uL (ref 3.87–5.11)
RDW: 13.9 % (ref 11.5–15.5)
WBC: 22.2 10*3/uL — ABNORMAL HIGH (ref 4.0–10.5)
nRBC: 0 % (ref 0.0–0.2)

## 2023-07-15 LAB — RPR: RPR Ser Ql: NONREACTIVE

## 2023-07-15 MED ORDER — FLEET ENEMA 7-19 GM/118ML RE ENEM: 1.0000 | ENEMA | RECTAL | Status: DC | PRN

## 2023-07-15 MED ORDER — LIDOCAINE HCL (PF) 1 % IJ SOLN
30.0000 mL | INTRAMUSCULAR | Status: DC | PRN
  Filled 2023-07-15: qty 30

## 2023-07-15 MED ORDER — OXYCODONE-ACETAMINOPHEN 5-325 MG PO TABS: 1.0000 | ORAL_TABLET | ORAL | Status: DC | PRN

## 2023-07-15 MED ORDER — DIPHENHYDRAMINE HCL 25 MG PO CAPS: 25.0000 mg | ORAL_CAPSULE | Freq: Four times a day (QID) | ORAL | Status: DC | PRN

## 2023-07-15 MED ORDER — ONDANSETRON HCL 4 MG/2ML IJ SOLN: 4.0000 mg | INTRAMUSCULAR | Status: DC | PRN

## 2023-07-15 MED ORDER — OXYTOCIN-SODIUM CHLORIDE 30-0.9 UT/500ML-% IV SOLN: 2.5000 [IU]/h | INTRAVENOUS | Status: DC

## 2023-07-15 MED ORDER — PRENATAL MULTIVITAMIN CH
1.0000 | ORAL_TABLET | Freq: Every day | ORAL | Status: DC
  Administered 2023-07-15 – 2023-07-16 (×2): 1 via ORAL
  Filled 2023-07-15 (×2): qty 1

## 2023-07-15 MED ORDER — COCONUT OIL OIL: 1.0000 | TOPICAL_OIL | Status: DC | PRN

## 2023-07-15 MED ORDER — OXYTOCIN 10 UNIT/ML IJ SOLN
INTRAMUSCULAR | Status: AC
  Administered 2023-07-15: 10 [IU] via INTRAMUSCULAR
  Filled 2023-07-15: qty 1

## 2023-07-15 MED ORDER — FENTANYL CITRATE (PF) 100 MCG/2ML IJ SOLN: 50.0000 ug | INTRAMUSCULAR | Status: DC | PRN

## 2023-07-15 MED ORDER — ONDANSETRON HCL 4 MG PO TABS: 4.0000 mg | ORAL_TABLET | ORAL | Status: DC | PRN

## 2023-07-15 MED ORDER — LACTATED RINGERS IV SOLN: 500.0000 mL | INTRAVENOUS | Status: DC | PRN

## 2023-07-15 MED ORDER — IBUPROFEN 600 MG PO TABS
600.0000 mg | ORAL_TABLET | Freq: Four times a day (QID) | ORAL | Status: DC
  Administered 2023-07-15 – 2023-07-16 (×4): 600 mg via ORAL
  Filled 2023-07-15 (×5): qty 1

## 2023-07-15 MED ORDER — SIMETHICONE 80 MG PO CHEW: 80.0000 mg | CHEWABLE_TABLET | ORAL | Status: DC | PRN

## 2023-07-15 MED ORDER — ACETAMINOPHEN 325 MG PO TABS: 650.0000 mg | ORAL_TABLET | ORAL | Status: DC | PRN

## 2023-07-15 MED ORDER — SOD CITRATE-CITRIC ACID 500-334 MG/5ML PO SOLN: 30.0000 mL | ORAL | Status: DC | PRN

## 2023-07-15 MED ORDER — OXYTOCIN BOLUS FROM INFUSION: 333.0000 mL | Freq: Once | INTRAVENOUS | Status: DC

## 2023-07-15 MED ORDER — LACTATED RINGERS IV SOLN: INTRAVENOUS | Status: DC

## 2023-07-15 MED ORDER — SENNOSIDES-DOCUSATE SODIUM 8.6-50 MG PO TABS
2.0000 | ORAL_TABLET | ORAL | Status: DC
  Administered 2023-07-16: 2 via ORAL
  Filled 2023-07-15: qty 2

## 2023-07-15 MED ORDER — DIBUCAINE (PERIANAL) 1 % EX OINT: 1.0000 | TOPICAL_OINTMENT | CUTANEOUS | Status: DC | PRN

## 2023-07-15 MED ORDER — TETANUS-DIPHTH-ACELL PERTUSSIS 5-2.5-18.5 LF-MCG/0.5 IM SUSY: 0.5000 mL | PREFILLED_SYRINGE | Freq: Once | INTRAMUSCULAR | Status: DC

## 2023-07-15 MED ORDER — OXYTOCIN 10 UNIT/ML IJ SOLN: 10.0000 [IU] | Freq: Once | INTRAMUSCULAR | Status: AC

## 2023-07-15 MED ORDER — ONDANSETRON HCL 4 MG/2ML IJ SOLN: 4.0000 mg | Freq: Four times a day (QID) | INTRAMUSCULAR | Status: DC | PRN

## 2023-07-15 MED ORDER — WITCH HAZEL-GLYCERIN EX PADS: 1.0000 | MEDICATED_PAD | CUTANEOUS | Status: DC | PRN

## 2023-07-15 MED ORDER — BENZOCAINE-MENTHOL 20-0.5 % EX AERO: 1.0000 | INHALATION_SPRAY | CUTANEOUS | Status: DC | PRN

## 2023-07-15 NOTE — H&P (Signed)
OB ADMISSION/ HISTORY & PHYSICAL:  Admission Date: 07/15/2023  8:57 AM  Admit Diagnosis: Normal  Lauren Bonilla is a 27 y.o. female G2P1001 [redacted]w[redacted]d presenting for labor eval. Endorses active FM, denies LOF and vaginal bleeding. Ctx began @ 0700. Low-risk pregnancy w/ microcytic anemia and rubella immune  History of current pregnancy: G2P1001   Patient entered care with [facility] at 13+4 wks.   EDC 07/20/23 by LMP and congruent w/ 1st trimester wk U/S.   Significant prenatal events:  Patient Active Problem List   Diagnosis Date Noted   Normal labor and delivery 07/15/2023   Rubella non-immune status, antepartum 07/15/2023    Prenatal Labs: ABO, Rh:  AB pos Antibody:  negative Rubella:   non-immune RPR:   NR HBsAg:   NR HIV:   NR GTT: normal 1 hr GBS:   neg GC/CHL: neg/neg Genetics: low-risk, horizon neg   OB History  Gravida Para Term Preterm AB Living  2 1 1  0 0 1  SAB IAB Ectopic Multiple Live Births  0 0 0 0 1    # Outcome Date GA Lbr Len/2nd Weight Sex Type Anes PTL Lv  2 Current           1 Term 06/25/19 [redacted]w[redacted]d 09:51 / 02:57 3181 g F Vag-Spont EPI  LIV    Medical / Surgical History: Past medical history:  Past Medical History:  Diagnosis Date   Acute appendicitis 06/18/2015   Anemia    Appendicitis with perforation 06/18/2015   Microcytic anemia    Normal labor 06/25/2019   Normal postpartum course 06/26/2019    Past surgical history:  Past Surgical History:  Procedure Laterality Date   APPENDECTOMY     LAPAROSCOPIC APPENDECTOMY N/A 06/18/2015   Procedure: APPENDECTOMY LAPAROSCOPIC;  Surgeon: Leonia Corona, MD;  Location: [facility] OR;  Service: Pediatrics;  Laterality: N/A;   Family History:  Family History  Problem Relation Age of Onset   Asthma Neg Hx    Cancer Neg Hx    Diabetes Neg Hx    Heart disease Neg Hx    Hypertension Neg Hx     Social History:  reports that she has never smoked. She has never used smokeless tobacco. She reports that she does not drink  alcohol and does not use drugs.  Allergies: Patient has no known allergies.   Current Medications at time of admission:  Prior to Admission medications   Medication Sig Start Date End Date Taking? Authorizing Provider  Cholecalciferol (VITAMIN D3) 250 MCG (10000 UT) CAPS Take by mouth.    [provider]  ferrous sulfate 325 (65 FE) MG EC tablet Take 325 mg by mouth 3 (three) times daily with meals.    [provider]  ibuprofen (ADVIL) 600 MG tablet Take 1 tablet (600 mg total) by mouth every 6 (six) hours. Patient not taking: Reported on 02/23/2023 06/27/19   Dale , FNP  prenatal vitamin w/FE, FA (PRENATAL 1 + 1) 27-1 MG TABS tablet Take 1 tablet by mouth daily at 12 noon.    [provider]    Review of Systems: Constitutional: Negative   HENT: Negative   Eyes: Negative   Respiratory: Negative   Cardiovascular: Negative   Gastrointestinal: Negative  Genitourinary: pos for bloody show, neg for LOF   Musculoskeletal: Negative   Skin: Negative   Neurological: Negative   Endo/Heme/Allergies: Negative   Psychiatric/Behavioral: Negative    Physical Exam: VS: Blood pressure 125/78, pulse 91, temperature 98.1 F (36.7 C), temperature  source Oral, resp. rate 20, last menstrual period 10/21/2022, SpO2 98%, unknown if currently breastfeeding. AAO x3, no signs of distress Cardiovascular: RRR Respiratory: Unlabored GU/GI: Abdomen gravid, non-tender, non-distended, active FM, vertex Extremities: no edema, negative for pain, tenderness, and cords  Cervical exam:Dilation: Lip/rim Effacement (%): 100 Station: 0 Exam by:: Erle Crocker RN FHR: baseline rate 115 / variability moderate / accelerations present / absent decelerations TOCO: 3   Prenatal Transfer Tool  Maternal Diabetes: No Genetic Screening: Normal Maternal Ultrasounds/Referrals: Normal Fetal Ultrasounds or other Referrals:  None Maternal Substance Abuse:  No Significant Maternal  Medications:  None Significant Maternal Lab Results: Group B Strep negative Number of Prenatal Visits:greater than 3 verified prenatal visits Other Comments:  None    Assessment: 27 y.o. G2P1001 [redacted]w[redacted]d  Active stage of labor Precipitous labor FHR category 1 GBS neg Pain management plan: per pt request   Plan:  Admit to L&D Routine admission orders Epidural PRN Expectant management Dr Connye Burkitt notified of admission and plan of care  Roma Schanz DNP, CNM 07/15/2023 9:15 AM

## 2023-07-15 NOTE — Lactation Note (Signed)
This note was copied from a baby's chart. Lactation Consultation Note  Patient Name: Lauren Bonilla Today's Date: 07/15/2023 Age:25 hours Reason for consult: Initial assessment;Term.  Infant is latching well at the breast, Birth Parent is feeding infant by hunger cues, on demand, 8 to 12+ times within 24 hours,  Birth Parent latched infant on her left breast using the cross cradle hold, infant latched with depth, breastfeed for 7 minutes. Birth Parent was burping infant as LC left the room, and plans to re-latch him again at the breast if he continue to show hunger cues. Birth Parent is experienced with breastfeeding see maternal data below. LC discussed infant's input and output and changed void diaper while in room, infant had one void and one stool diaper since birth. LC discussed the importance of maternal rest, diet and hydration. Birth Parent was  made aware of O/P services, breastfeeding support groups, community resources, and our phone # for post-discharge questions.    Maternal Data Has patient been taught Hand Expression?: Yes Does the patient have breastfeeding experience prior to this delivery?: Yes How long did the patient breastfeed?: Per Birth Parent, she BF her 42 year old daughter for 18 months.  Feeding Mother's Current Feeding Choice: Breast Milk and Formula  LATCH Score Latch: Grasps breast easily, tongue down, lips flanged, rhythmical sucking.  Audible Swallowing: A few with stimulation  Type of Nipple: Everted at rest and after stimulation  Comfort (Breast/Nipple): Soft / non-tender  Hold (Positioning): Assistance needed to correctly position infant at breast and maintain latch.  LATCH Score: 8   Lactation Tools Discussed/Used    Interventions Interventions: Support pillows;Position options;Skin to skin;Assisted with latch;Breast feeding basics reviewed;Adjust position;Education;Breast compression;LC Services brochure  Discharge Pump:  [facility] sent referral for  [employer])  Consult Status Consult Status: Follow-up Date: 07/16/23 Follow-up type: In-patient    Frederico Hamman 07/15/2023, 9:55 PM

## 2023-07-15 NOTE — MAU Note (Signed)
Patient arrived to MAU reporting CTX's with an urge to push. RN assisted patient into stretcher and applied FHR monitors. Initial FHT at 120. CTX's palpating strong.   SVE performed. Patient [phone] with a BBOW.  Steva Ready, DO, notified and report given. DO not in house but reports [place], CNM, may still be. RN to call CNM.  This RN called Rhea Pink, CNM, and report given. CNM in house and to see patient. RN informed CNM that patient is going to 4. CNM stated she would place admit orders.   [employer] civil engineer, called by [employer], RN, and report given. Patient to go to room 218.  Awilda Bill, NP, assisted transport of patient to room 218.

## 2023-07-16 ENCOUNTER — Encounter (HOSPITAL_COMMUNITY): Payer: Self-pay | Admitting: Obstetrics and Gynecology

## 2023-07-16 ENCOUNTER — Other Ambulatory Visit: Payer: Self-pay

## 2023-07-16 LAB — CBC
HCT: 28.7 % — ABNORMAL LOW (ref 36.0–46.0)
Hemoglobin: 8.8 g/dL — ABNORMAL LOW (ref 12.0–15.0)
MCH: 22.3 pg — ABNORMAL LOW (ref 26.0–34.0)
MCHC: 30.7 g/dL (ref 30.0–36.0)
MCV: 72.7 fL — ABNORMAL LOW (ref 80.0–100.0)
Platelets: 211 10*3/uL (ref 150–400)
RBC: 3.95 MIL/uL (ref 3.87–5.11)
RDW: 13.8 % (ref 11.5–15.5)
WBC: 16.1 10*3/uL — ABNORMAL HIGH (ref 4.0–10.5)
nRBC: 0 % (ref 0.0–0.2)

## 2023-07-16 MED ORDER — IBUPROFEN 600 MG PO TABS: 600.0000 mg | ORAL_TABLET | Freq: Four times a day (QID) | ORAL | 1 refills | Status: AC | PRN

## 2023-07-16 MED ORDER — DOCUSATE SODIUM 100 MG PO CAPS: 100.0000 mg | ORAL_CAPSULE | Freq: Two times a day (BID) | ORAL | 1 refills | Status: AC | PRN

## 2023-07-16 MED ORDER — MEASLES, MUMPS & RUBELLA VAC IJ SOLR: 0.5000 mL | Freq: Once | INTRAMUSCULAR | Status: DC

## 2023-07-16 MED ORDER — FERROUS SULFATE 325 (65 FE) MG PO TABS: 325.0000 mg | ORAL_TABLET | Freq: Every day | ORAL | 3 refills | Status: AC

## 2023-07-16 NOTE — Discharge Summary (Signed)
Postpartum Discharge Summary  Date of Service: 07/16/23     Patient Name: Lauren Bonilla DOB: Dec 01, 1996 MRN: [id number]  Date of admission: 07/15/2023 Delivery date:07/15/2023 Delivering provider: Rhea Pink B Date of discharge: 07/16/2023  Admitting diagnosis: Normal labor [O80, Z37.9] Intrauterine pregnancy: [redacted]w[redacted]d     Secondary diagnosis:  Active Problems:   Normal labor and delivery   Rubella non-immune status, antepartum  Additional problems: None    Discharge diagnosis: Term Pregnancy Delivered                                              Post partum procedures: None Augmentation: N/A Complications: None  Hospital course: Onset of Labor With Vaginal Delivery      27 y.o. yo [id number] at [redacted]w[redacted]d was admitted in Active Labor on 07/15/2023. Labor course was uncomplicated.  Membrane Rupture Time/Date: 9:25 AM,07/15/2023  Delivery Method:Vaginal, Spontaneous Operative Delivery:N/A Episiotomy: None Lacerations:  1st degree Patient had an uncomplicated postpartum course.  She is ambulating, tolerating a regular diet, passing flatus, and urinating well. Patient is discharged home in stable condition on 07/16/23.  Newborn Data: Birth date:07/15/2023 Birth time:9:38 AM Gender:Female Living status:Living Apgars:9 ,9  [phone] g  Circumcision Consent:  Routine circumcisions performed on newborns have been identified as voluntary, elective procedures by [employer] such as the [employer] [facility].  It is considered an elective procedure with no definitive medical indication and carries risks. Risks include but are not limited to bleeding, infection, damage to penis with possible need for further surgery, poor cosmesis, and local anesthetic risks. Circumcision will only be performed if patient is deemed to have normal anatomy by his Pediatrician, meets adequate criteria for a newborn of similar gestational age after birth and is without infection or other medical issue  contraindicating an elective procedure. Patient understands and agrees. Patient discussed with mother of infant.  Magnesium Sulfate received: No BMZ received: No Rhophylac:N/A MMR: Ordered prior to discharge T-DaP:Given prenatally Flu: N/A Transfusion:No  Physical exam  Vitals:   07/15/23 1653 07/15/23 2120 07/16/23 0203 07/16/23 0500  BP: 107/62 107/66 112/71 104/66  Pulse: 75 75 84 87  Resp: 16 18 18 18   Temp: 98.2 F (36.8 C) 97.8 F (36.6 C) 98.1 F (36.7 C) 98.1 F (36.7 C)  TempSrc:  Oral Oral Oral  SpO2: 99%      General: alert, cooperative, and no distress Lochia: appropriate Uterine Fundus: firm Incision: N/A DVT Evaluation: No evidence of DVT seen on physical exam. No cords or calf tenderness. Labs: Lab Results  Component Value Date   WBC 16.1 (H) 07/16/2023   HGB 8.8 (L) 07/16/2023   HCT 28.7 (L) 07/16/2023   MCV 72.7 (L) 07/16/2023   PLT 211 07/16/2023      Latest Ref Rng & Units 06/25/2019   11:07 AM  CMP  Glucose 70 - 99 mg/dL 74   BUN 6 - 20 mg/dL 8   Creatinine 3.01 - 6.01 mg/dL 0.93   Sodium 235 - 573 mmol/L 140   Potassium 3.5 - 5.1 mmol/L 4.1   Chloride 98 - 111 mmol/L 105   CO2 22 - 32 mmol/L 23   Calcium 8.9 - 10.3 mg/dL 9.8   Total Protein 6.5 - 8.1 g/dL 6.7   Total Bilirubin 0.3 - 1.2 mg/dL 0.2   Alkaline Phos 38 - 126 U/L 187   AST 15 -  41 U/L 20   ALT 0 - 44 U/L 9    Edinburgh Score:    07/16/2023    5:19 AM  Edinburgh Postnatal Depression Scale Screening Tool  I have been able to laugh and see the funny side of things. 0  I have looked forward with enjoyment to things. 0  I have blamed myself unnecessarily when things went wrong. 0  I have been anxious or worried for no good reason. 0  I have felt scared or panicky for no good reason. 0  Things have been getting on top of me. 1  I have been so unhappy that I have had difficulty sleeping. 0  I have felt sad or miserable. 0  I have been so unhappy that I have been crying. 0   The thought of harming myself has occurred to me. 0  Edinburgh Postnatal Depression Scale Total 1      After visit meds:  Allergies as of 07/16/2023   No Known Allergies      Medication List     TAKE these medications    docusate sodium 100 MG capsule Commonly known as: Colace Take 1 capsule (100 mg total) by mouth 2 (two) times daily as needed.   ferrous sulfate 325 (65 FE) MG EC tablet Take 325 mg by mouth 3 (three) times daily with meals. What changed: Another medication with the same name was added. Make sure you understand how and when to take each.   ferrous sulfate 325 (65 FE) MG tablet Take 1 tablet (325 mg total) by mouth daily with breakfast. What changed: You were already taking a medication with the same name, and this prescription was added. Make sure you understand how and when to take each.   ibuprofen 600 MG tablet Commonly known as: ADVIL Take 1 tablet (600 mg total) by mouth every 6 (six) hours as needed. What changed:  when to take this reasons to take this   prenatal vitamin w/FE, FA 27-1 MG Tabs tablet Take 1 tablet by mouth daily at 12 noon.   Vitamin D3 250 MCG (10000 UT) capsule Take by mouth.         Discharge home in stable condition Infant Feeding: Breast Infant Disposition:home with mother Discharge instruction: per After Visit Summary and Postpartum booklet. Activity: Advance as tolerated. Pelvic rest for 6 weeks.  Diet: routine diet Anticipated Birth Control:  To be discussed at PPV Postpartum Appointment:6 weeks Additional Postpartum F/U:  None Future Appointments:No future appointments. Follow up Visit:  Follow-up Information     [facility], [facility] [place] Follow up in 6 week(s).   Specialty: Obstetrics and Gynecology Why: Please call to schedule your 6 week postpartum visit. Contact information: [address] [place] [address] [phone]                     07/16/2023 Steva Ready,  DO

## 2023-07-27 ENCOUNTER — Inpatient Hospital Stay (HOSPITAL_COMMUNITY): Payer: Medicaid Other

## 2023-07-27 ENCOUNTER — Inpatient Hospital Stay (HOSPITAL_COMMUNITY)
Admission: RE | Admit: 2023-07-27 | Payer: Medicaid Other | Source: Home / Self Care | Admitting: Obstetrics and Gynecology

## 2023-07-28 ENCOUNTER — Inpatient Hospital Stay (HOSPITAL_COMMUNITY)
Admission: AD | Admit: 2023-07-28 | Discharge: 2023-07-28 | Disposition: A | Discharge status: Home / Self Care | Payer: Medicaid Other | Source: Home / Self Care | Admitting: Obstetrics and Gynecology

## 2023-07-28 ENCOUNTER — Encounter (HOSPITAL_COMMUNITY): Payer: Self-pay | Admitting: Obstetrics and Gynecology

## 2023-07-28 ENCOUNTER — Other Ambulatory Visit: Payer: Self-pay

## 2023-07-28 ENCOUNTER — Inpatient Hospital Stay (HOSPITAL_COMMUNITY): Payer: Medicaid Other

## 2023-07-28 DIAGNOSIS — N898 Other specified noninflammatory disorders of vagina: Secondary | ICD-10-CM | POA: Insufficient documentation

## 2023-07-28 DIAGNOSIS — O9089 Other complications of the puerperium, not elsewhere classified: Secondary | ICD-10-CM | POA: Insufficient documentation

## 2023-07-28 DIAGNOSIS — N939 Abnormal uterine and vaginal bleeding, unspecified: Secondary | ICD-10-CM

## 2023-07-28 LAB — CBC
HCT: 38 % (ref 36.0–46.0)
Hemoglobin: 11.3 g/dL — ABNORMAL LOW (ref 12.0–15.0)
MCH: 21.6 pg — ABNORMAL LOW (ref 26.0–34.0)
MCHC: 29.7 g/dL — ABNORMAL LOW (ref 30.0–36.0)
MCV: 72.7 fL — ABNORMAL LOW (ref 80.0–100.0)
Platelets: 385 10*3/uL (ref 150–400)
RBC: 5.23 MIL/uL — ABNORMAL HIGH (ref 3.87–5.11)
RDW: 13 % (ref 11.5–15.5)
WBC: 8.4 10*3/uL (ref 4.0–10.5)
nRBC: 0 % (ref 0.0–0.2)

## 2023-07-28 LAB — TYPE AND SCREEN
ABO/RH(D): AB POS
Antibody Screen: NEGATIVE

## 2023-07-28 MED ORDER — MISOPROSTOL 200 MCG PO TABS: 400.0000 ug | ORAL_TABLET | Freq: Two times a day (BID) | ORAL | 0 refills | Status: AC

## 2023-07-28 NOTE — MAU Note (Addendum)
.  Lauren Bonilla is a 27 y.o. pp pt who had a svd on 07/15/23. States her bleeding had decreased and was on and off. This am she passed a fist-sized clot and filled a pad with blood. Was told to come in and be checked. Having mild abdominal and some vaginal pain. Pt states she has been achy on Thurs and thinks may due to breastfeeding   Onset of complaint: 0100 Pain score: 3 Vitals:   07/28/23 0421 07/28/23 0425  BP:  104/73  Pulse: 83   Resp: 17   Temp: 98.5 F (36.9 C)   SpO2: 100%      FHT:n/a Lab orders placed from triage:

## 2023-07-28 NOTE — ED Triage Notes (Signed)
Patient reports vaginal bleeding with clots this evening , she is 2 weeks post partum , no abdominal pain or cramping.

## 2023-07-28 NOTE — ED Provider Notes (Signed)
  Physical Exam  BP 116/75 (BP Location: Right Arm)   Pulse 90   Temp 98.7 F (37.1 C) (Oral)   Resp 16   LMP 10/21/2022   SpO2 99%   Physical Exam  Procedures  Procedures  ED Course / MDM    Medical Decision Making  27 year old female patient presenting to the emergency department complaining of vaginal bleeding with clots that began this evening.  She is 2 weeks postpartum.  She denies any abdominal pain or cramping.  I discussed the case with the MAU APP who agreed that the patient should be transferred to the MAU for further evaluation and management.       Darrick Grinder, PA-C 07/28/23 0334    Gilda Crease, MD 07/28/23 [phone]

## 2023-07-28 NOTE — ED Notes (Signed)
Report given to MAU RN on patient's transfer.  

## 2023-07-28 NOTE — MAU Provider Note (Signed)
Chief Complaint:  Vaginal Bleeding (2 weeks post partum )   Event Date/Time   First Provider Initiated Contact with Patient 07/28/23 0434       HPI: Lauren Bonilla is a 27 y.o. [id number] who is 2 weeks postpartum presents to maternity admissions reporting increased bleeding tonight.  Passed a large clot then had heavy bleeding. Now bleeding has lightened up. She reports vaginal bleeding, vaginal itching/burning, urinary symptoms, h/a, dizziness, n/v, or fever/chills.    Vaginal Bleeding The patient's primary symptoms include pelvic pain and vaginal bleeding. The patient's pertinent negatives include no genital itching or genital odor. The current episode started today. The problem has been rapidly improving. The problem affects both sides. She is not pregnant. Associated symptoms include abdominal pain. Pertinent negatives include no chills, fever, frequency or vomiting. The vaginal discharge was bloody. She has been passing clots. She has not been passing tissue. Nothing aggravates the symptoms. She has tried nothing for the symptoms.   RN Note: Lauren Bonilla is a 27 y.o. pp pt who is 2 weeks postpartum and had a svd on 07/15/23. States her bleeding had decreased and was on and off. This am she passed a fist-sized clot and filled a pad with blood. Was told to come in and be checked. Having mild abdominal and some vaginal pain. Pt states she has been achy on Thurs and thinks may due to breastfeeding   Onset of complaint: 0100             Pain score: 3  Past Medical History: Past Medical History:  Diagnosis Date   Acute appendicitis 06/18/2015   Anemia    Appendicitis with perforation 06/18/2015   Microcytic anemia    Normal labor 06/25/2019   Normal postpartum course 06/26/2019    Past obstetric history: OB History  Gravida Para Term Preterm AB Living  2 2 2  0 0 2  SAB IAB Ectopic Multiple Live Births  0 0 0 0 2    # Outcome Date GA Lbr Len/2nd Weight Sex Type Anes PTL Lv  2 Term 07/15/23 [redacted]w[redacted]d  02:35 / 00:03 2977 g M Vag-Spont None  LIV     Birth Comments: wnl  1 Term 06/25/19 [redacted]w[redacted]d 09:51 / 02:57 3181 g F Vag-Spont EPI  LIV    Past Surgical History: Past Surgical History:  Procedure Laterality Date   APPENDECTOMY     LAPAROSCOPIC APPENDECTOMY N/A 06/18/2015   Procedure: APPENDECTOMY LAPAROSCOPIC;  Surgeon: Leonia Corona, MD;  Location: [facility] OR;  Service: Pediatrics;  Laterality: N/A;    Family History: Family History  Problem Relation Age of Onset   Asthma Neg Hx    Cancer Neg Hx    Diabetes Neg Hx    Heart disease Neg Hx    Hypertension Neg Hx     Social History: Social History   Tobacco Use   Smoking status: Never   Smokeless tobacco: Never  Vaping Use   Vaping status: Never Used  Substance Use Topics   Alcohol use: No   Drug use: No    Allergies: No Known Allergies  Meds:  Medications Prior to Admission  Medication Sig Dispense Refill Last Dose   Cholecalciferol (VITAMIN D3) 250 MCG (10000 UT) CAPS Take by mouth.      docusate sodium (COLACE) 100 MG capsule Take 1 capsule (100 mg total) by mouth 2 (two) times daily as needed. 60 capsule 1    ferrous sulfate 325 (65 FE) MG tablet Take 1 tablet (325 mg total)  by mouth daily with breakfast. 60 tablet 3    ibuprofen (ADVIL) 600 MG tablet Take 1 tablet (600 mg total) by mouth every 6 (six) hours as needed. 30 tablet 1    prenatal vitamin w/FE, FA (PRENATAL 1 + 1) 27-1 MG TABS tablet Take 1 tablet by mouth daily at 12 noon.       I have reviewed patient's Past Medical Hx, Surgical Hx, Family Hx, Social Hx, medications and allergies.  ROS:  Review of Systems  Constitutional:  Negative for chills and fever.  Gastrointestinal:  Positive for abdominal pain. Negative for vomiting.  Genitourinary:  Positive for pelvic pain and vaginal bleeding. Negative for frequency.   Other systems negative     Physical Exam  Patient Vitals for the past 24 hrs:  BP Temp Temp src Pulse Resp SpO2 Height Weight  07/28/23  0425 104/73 -- -- -- -- -- -- --  07/28/23 0421 -- 98.5 F (36.9 C) -- 83 17 100 % 4\' 11"  (1.499 m) 55.8 kg  07/28/23 0251 116/75 98.7 F (37.1 C) Oral 90 16 99 % -- --   Constitutional: Well-developed, well-nourished female in no acute distress.  Cardiovascular: normal rate  Respiratory: normal effort, no distress GI: Abd soft, non-tender.  Nondistended.  No rebound, No guarding.  Bowel Sounds audible  MS: Extremities nontender, no edema, normal ROM Neurologic: Alert and oriented x 4.   Grossly nonfocal. GU: Neg CVAT. Skin:  Warm and Dry Psych:  Affect appropriate.  PELVIC EXAM: small to moderate bleeding   Labs: Results for orders placed or performed during the hospital encounter of 07/28/23 (from the past 24 hour(s))  Type and screen [facility]     Status: None   Collection Time: 07/28/23  4:43 AM  Result Value Ref Range   ABO/RH(D) AB POS    Antibody Screen NEG    Sample Expiration      07/31/2023,2359 Performed at [facility], [address], [place], [place] [address]   CBC     Status: Abnormal   Collection Time: 07/28/23  4:47 AM  Result Value Ref Range   WBC 8.4 4.0 - 10.5 K/uL   RBC 5.23 (H) 3.87 - 5.11 MIL/uL   Hemoglobin 11.3 (L) 12.0 - 15.0 g/dL   HCT 60.4 54.0 - 98.1 %   MCV 72.7 (L) 80.0 - 100.0 fL   MCH 21.6 (L) 26.0 - 34.0 pg   MCHC 29.7 (L) 30.0 - 36.0 g/dL   RDW 19.1 47.8 - 29.5 %   Platelets 385 150 - 400 K/uL   nRBC 0.0 0.0 - 0.2 %   Prior Hgb was 8.8 two weeks ago --/--/AB POS (08/03 1140)  Imaging:  US Pelvis Limited  Result Date: 07/28/2023 CLINICAL DATA:  Postpartum bleeding, rule out retained products EXAM: TRANSABDOMINAL ULTRASOUND OF PELVIS TECHNIQUE: Transabdominal ultrasound examination of the pelvis was performed including evaluation of the uterus, ovaries, adnexal regions, and pelvic cul-de-sac. COMPARISON:  05/22/2023 FINDINGS: Uterus Measurements: 10 x 7 x 10 cm = volume: 380 mL. No fibroids or other mass  visualized. Endometrium Heterogeneous thickening in the endometrial cavity to 2.3 cm with both solid and fluid appearing components. Internal vascularity is present. No focal mass or AVM detected. Right ovary Measurements: 28 x 11 x 16 mm = volume: 2.5 mL. Normal appearance/no adnexal mass. Left ovary Measurements: 29 x 15 x 28 mm = volume: 6.5 mL. Normal appearance/no adnexal mass. Other findings:  No abnormal free fluid.  IMPRESSION: Heterogeneous thickening of the endometrial cavity (2.3 cm) with vascularity. Electronically Signed   By: Tiburcio Pea M.D.   On: 07/28/2023 05:41     MAU Course/MDM: I have reviewed the triage vital signs and the nursing notes.   Pertinent labs & imaging results that were available during my care of the patient were reviewed by me and considered in my medical decision making (see chart for details).      I have reviewed her medical records including past results, notes and treatments.   I have ordered labs as follows: CBC, T&S  Hemoglobin has increased Imaging ordered: US showed thickened endometrium with vascular flow Results reviewed.   Consult Dr Berton Lan and Richardson Dopp.  Patient offered medical therapy with Cytotec or surgery   She wants to try medical therapy first.    Pt stable at time of discharge.  Assessment: 1. Vaginal bleeding   2.   Postpartum bleeding 3.   Probable retained products of conception  Plan: Discharge home Recommend Monitor bleeding Rx sent for Cytotec bid for 3 days for bleeding Dr Richardson Dopp will notify Dr Sallye Ober to arrage followup in office  Encouraged to return here or to other Urgent Care/ED if she develops worsening of symptoms, increase in pain, fever, or other concerning symptoms.   Wynelle Bourgeois CNM, MSN Certified Nurse-Midwife 07/28/2023 4:34 AM

## 2023-08-09 ENCOUNTER — Telehealth (HOSPITAL_COMMUNITY): Payer: Self-pay | Admitting: *Deleted

## 2023-08-09 NOTE — Telephone Encounter (Signed)
08/09/2023  Name: Lauren Bonilla MRN: [id number] DOB: [birth year]  Reason for Call:  Transition of Care Hospital Discharge Call  Contact Status: Patient Contact Status: Message  Language assistant needed:          Follow-Up Questions:    Inocente Salles Postnatal Depression Scale:  In the Past 7 Days:    PHQ2-9 Depression Scale:     Discharge Follow-up:    Post-discharge interventions: Reviewed Newborn Safe Sleep Practices  Salena Saner, RN 08/09/2023 15:47
